# Patient Record
Sex: Female | Born: 1945 | Race: Black or African American | Hispanic: No | Marital: Single | Smoking: Never smoker
Health system: Southern US, Community
[De-identification: ages and names within clinical notes are randomized; demographics above are authoritative.]

## PROBLEM LIST (undated history)

## (undated) DIAGNOSIS — K219 Gastro-esophageal reflux disease without esophagitis: Secondary | ICD-10-CM

## (undated) DIAGNOSIS — E785 Hyperlipidemia, unspecified: Secondary | ICD-10-CM

## (undated) DIAGNOSIS — F0280 Dementia in other diseases classified elsewhere without behavioral disturbance: Secondary | ICD-10-CM

## (undated) DIAGNOSIS — E119 Type 2 diabetes mellitus without complications: Secondary | ICD-10-CM

## (undated) DIAGNOSIS — I10 Essential (primary) hypertension: Secondary | ICD-10-CM

## (undated) HISTORY — DX: Hyperlipidemia, unspecified: E78.5

## (undated) HISTORY — DX: Dementia in other diseases classified elsewhere, unspecified severity, without behavioral disturbance, psychotic disturbance, mood disturbance, and anxiety: F02.80

## (undated) HISTORY — DX: Gastro-esophageal reflux disease without esophagitis: K21.9

## (undated) HISTORY — DX: Type 2 diabetes mellitus without complications: E11.9

---

## 2016-08-08 ENCOUNTER — Emergency Department
Admission: EM | Admit: 2016-08-08 | Discharge: 2016-08-08 | Disposition: A | Discharge status: Home / Self Care | Payer: Medicare HMO | Attending: Emergency Medicine | Admitting: Emergency Medicine

## 2016-08-08 ENCOUNTER — Encounter: Payer: Self-pay | Admitting: Emergency Medicine

## 2016-08-08 DIAGNOSIS — R600 Localized edema: Secondary | ICD-10-CM | POA: Diagnosis present

## 2016-08-08 DIAGNOSIS — I10 Essential (primary) hypertension: Secondary | ICD-10-CM | POA: Insufficient documentation

## 2016-08-08 DIAGNOSIS — Z79899 Other long term (current) drug therapy: Secondary | ICD-10-CM | POA: Insufficient documentation

## 2016-08-08 DIAGNOSIS — T783XXA Angioneurotic edema, initial encounter: Secondary | ICD-10-CM

## 2016-08-08 HISTORY — DX: Essential (primary) hypertension: I10

## 2016-08-08 MED ORDER — METHYLPREDNISOLONE SODIUM SUCC 125 MG IJ SOLR
125.0000 mg | Freq: Once | INTRAMUSCULAR | Status: AC
  Administered 2016-08-08: 125 mg via INTRAVENOUS
  Filled 2016-08-08: qty 2

## 2016-08-08 MED ORDER — PREDNISONE 50 MG PO TABS: 50.0000 mg | ORAL_TABLET | Freq: Every day | ORAL | 0 refills | Status: DC

## 2016-08-08 MED ORDER — FAMOTIDINE IN NACL 20-0.9 MG/50ML-% IV SOLN
20.0000 mg | Freq: Once | INTRAVENOUS | Status: AC
  Administered 2016-08-08: 20 mg via INTRAVENOUS
  Filled 2016-08-08: qty 50

## 2016-08-08 NOTE — ED Provider Notes (Signed)
[facility] Emergency Department Provider Note   ____________________________________________    I have reviewed the triage vital signs and the nursing notes.   HISTORY  Chief Complaint Oral Swelling     HPI Sonya Curry is a 71 y.o. female Who presents with complaints of lip swelling. Daughter reports this started sometime after 2 AM in the morning because she saw her at 2 AM and her lips were normal however the patient does not know the exact time it started. Patient feels this occurs when she goes to bed "stressed out". Patient does take benazepril. This occurred 3-4 months ago as well.No intraoral swelling. No throat swelling. No difficulty breathing or swallowing. No rash   Past Medical History:  Diagnosis Date  . Hypertension     There are no active problems to display for this patient.   History reviewed. No pertinent surgical history.  Prior to Admission medications   Medication Sig Start Date End Date Taking? Authorizing Provider  alendronate (FOSAMAX) 70 MG tablet Take 70 mg by mouth every 7 (seven) days. 07/29/16  Yes [provider]  amLODipine (NORVASC) 10 MG tablet Take 10 mg by mouth daily. 04/29/16 04/29/17 Yes [provider]  aspirin EC 81 MG tablet Take 81 mg by mouth daily.   Yes [provider]  atorvastatin (LIPITOR) 40 MG tablet Take 40 mg by mouth daily. 04/29/16  Yes [provider]  donepezil (ARICEPT) 10 MG tablet Take 10 mg by mouth at bedtime.  07/17/16  Yes [provider]  FLUoxetine (PROZAC) 20 MG capsule Take 20 mg by mouth daily. 05/03/16  Yes [provider]  omeprazole (PRILOSEC) 20 MG capsule Take 20 mg by mouth daily. 07/18/16  Yes [provider]  triamterene-hydrochlorothiazide (DYAZIDE) 37.5-25 MG capsule Take 1 capsule by mouth daily. 06/08/16  Yes [provider]  predniSONE (DELTASONE) 50 MG tablet Take 1 tablet (50 mg total) by mouth daily  with breakfast. 08/08/16   Jene EveryKinner, Kaylinn Dedic, MD     Allergies Patient has no known allergies.  History reviewed. No pertinent family history.  Social History Social History  Substance Use Topics  . Smoking status: Never Smoker  . Smokeless tobacco: Never Used  . Alcohol use No    Review of Systems  Constitutional: No fever/chills Eyes: No visual changes.  ENT:no intraoral swelling Cardiovascular: Denies chest pain. Respiratory: Denies shortness of breath. Gastrointestinal:   No nausea, no vomiting.   Genitourinary: Negative for dysuria. Musculoskeletal: Negative for joint swelling Skin: small ulceration to the right forearm, healing well Neurological: Negative for headaches    ____________________________________________   PHYSICAL EXAM:  VITAL SIGNS: ED Triage Vitals  Enc Vitals Group     BP 08/08/16 1730 135/76     Pulse Rate 08/08/16 1731 83     Resp 08/08/16 1731 16     Temp 08/08/16 1731 98.2 F (36.8 C)     Temp Source 08/08/16 1731 Oral     SpO2 08/08/16 1731 95 %     Weight 08/08/16 1731 47.6 kg (105 lb)     Height 08/08/16 1731 1.549 m (5\' 1" )     Head Circumference --      Peak Flow --      Pain Score 08/08/16 1731 0     Pain Loc --      Pain Edu? --      Excl. in GC? --     Constitutional: Alert and oriented. No acute distress.  Pleasant and interactive Eyes: Conjunctivae are normal.  Head: Atraumatic. Nose: No congestion/rhinnorhea. Mouth/Throat: Mucous membranes are moist.  Swelling to both the upper and lower lips, equilaterally. Pharynx is normal, uvula is normal, no stridor Neck:  Painless ROM Cardiovascular: Normal rate, regular rhythm. Grossly normal heart sounds.  Good peripheral circulation. Respiratory: Normal respiratory effort.  No retractions. Lungs CTAB. Gastrointestinal: Soft and nontender. No distention.  No CVA tenderness. Genitourinary: deferred Musculoskeletal: No lower extremity tenderness nor edema.  Warm and well  perfused Neurologic:  Normal speech and language. No gross focal neurologic deficits are appreciated.  Skin:  Skin is warm, dry. Psychiatric: Mood and affect are normal. Speech and behavior are normal.  ____________________________________________   LABS (all labs ordered are listed, but only abnormal results are displayed)  Labs Reviewed - No data to display ____________________________________________  EKG  None ____________________________________________  RADIOLOGY  None ____________________________________________   PROCEDURES  Procedure(s) performed: No    Critical Care performed: No ____________________________________________   INITIAL IMPRESSION / ASSESSMENT AND PLAN / ED COURSE  Pertinent labs & imaging results that were available during my care of the patient were reviewed by me and considered in my medical decision making (see chart for details).  Patient presented with angioedema, likely related to benazepril. It appears that her lips and swollen for most of the day and it is reassuring that this does not seem to have worsened. We will start with Pepcid and steroids and observe in the department for any worsening   ----------------------------------------- 6:54 PM on 08/08/2016 -----------------------------------------  Patient has had some mild improvement of her swelling. No worsening. This is been stable throughout the day. Upper bed for discharge with outpatient follow-up. Strongly recommend discontinuing benazepril and discussing replacement with PCP    ____________________________________________   FINAL CLINICAL IMPRESSION(S) / ED DIAGNOSES  Final diagnoses:  Angioedema, initial encounter      NEW MEDICATIONS STARTED DURING THIS VISIT:  New Prescriptions   PREDNISONE (DELTASONE) 50 MG TABLET    Take 1 tablet (50 mg total) by mouth daily with breakfast.     Note:  This document was prepared using Dragon voice recognition software  and may include unintentional dictation errors.    Jene Every, MD 08/08/16 [phone]

## 2016-08-08 NOTE — ED Triage Notes (Signed)
Pt ambulatory to tx room in NAD, report lip swelling starting last night, denies any SOB, takes ace inhibitor.

## 2016-08-08 NOTE — ED Notes (Signed)
Pt states swelling to her lips that began at 2 pm, pt denies any distress, resp even and unlabored, pt speaking in full clear sentences, swelling noted to lips

## 2016-08-08 NOTE — Discharge Instructions (Signed)
I recommend discontinuing the Benazepril and discussing a replacement medication with your doctor. Please return if there are any worsening of your symptoms.

## 2017-01-14 ENCOUNTER — Emergency Department
Admission: EM | Admit: 2017-01-14 | Discharge: 2017-01-14 | Disposition: A | Discharge status: Home / Self Care | Payer: Medicare Other | Attending: Emergency Medicine | Admitting: Emergency Medicine

## 2017-01-14 DIAGNOSIS — G309 Alzheimer's disease, unspecified: Secondary | ICD-10-CM | POA: Diagnosis not present

## 2017-01-14 DIAGNOSIS — Z79899 Other long term (current) drug therapy: Secondary | ICD-10-CM | POA: Insufficient documentation

## 2017-01-14 DIAGNOSIS — F0281 Dementia in other diseases classified elsewhere with behavioral disturbance: Secondary | ICD-10-CM | POA: Diagnosis not present

## 2017-01-14 DIAGNOSIS — R4182 Altered mental status, unspecified: Secondary | ICD-10-CM | POA: Diagnosis present

## 2017-01-14 DIAGNOSIS — I10 Essential (primary) hypertension: Secondary | ICD-10-CM | POA: Diagnosis not present

## 2017-01-14 NOTE — ED Triage Notes (Addendum)
Patient ambulatory to triage with steady gait, without difficulty or distress noted, brought in by [place] deputy who reports he received call of pt was out walking, knocking on doors and didn't know why, unsure of how long she had been outside; took pt home & found pt lives alone; chart review indicates pt with hx alzheimers; pt reports that she is here because she was out walking and got cold; pt is alert to self and place but not time; denies any c/o; pt st that [place]; pt is unable to answer any questions regarding her history--unable to complete triage at this time; warm blanket given to pt

## 2017-01-14 NOTE — ED Notes (Addendum)
Sonya Curry with [facility] [phone]) called stating that a case has been opened for this patient. Pt is not able to live alone due to dementia and safety is a concern. APS states daughter is on her way to hospital and patient needs to be discharged to her care, they will follow up after discharge with patient and family to assure safety of living conditions. Daughter is POA and her name is Sonya Curry [phone]).

## 2017-01-14 NOTE — ED Provider Notes (Signed)
[facility] Emergency Department Provider Note    First MD Initiated Contact with Patient 01/14/17 [phone]     (approximate)  I have reviewed the triage vital signs and the nursing notes.  Level 5 caveat: History limited secondary to Alzheimer's disease HISTORY  Chief Complaint Altered Mental Status    HPI Sonya Curry is a 71 y.o. female with history of hypertension and Alzheimer's disease presents to the emergency department [place] who reports that the patient was found walking and knocking on the doors of homes.  Patient states that she remembers walking however does not know where she was was going.  Patient alert and oriented to self only.  Patient does live at home alone.  Patient denies any complaints at present.  Adult Protective Services have been notified.   Past Medical History:  Diagnosis Date  . Hypertension     There are no active problems to display for this patient.  Past surgical history None  Prior to Admission medications   Medication Sig Start Date End Date Taking? Authorizing Provider  alendronate (FOSAMAX) 70 MG tablet Take 70 mg by mouth every 7 (seven) days. 07/29/16   [provider]  amLODipine (NORVASC) 10 MG tablet Take 10 mg by mouth daily. 04/29/16 04/29/17  [provider]  aspirin EC 81 MG tablet Take 81 mg by mouth daily.    [provider]  atorvastatin (LIPITOR) 40 MG tablet Take 40 mg by mouth daily. 04/29/16   [provider]  donepezil (ARICEPT) 10 MG tablet Take 10 mg by mouth at bedtime.  07/17/16   [provider]  FLUoxetine (PROZAC) 20 MG capsule Take 20 mg by mouth daily. 05/03/16   [provider]  omeprazole (PRILOSEC) 20 MG capsule Take 20 mg by mouth daily. 07/18/16   [provider]  triamterene-hydrochlorothiazide (DYAZIDE) 37.5-25 MG capsule Take 1 capsule by mouth daily. 06/08/16   [provider]    Allergies No  known drug allergies No family history on file.  Social History Social History   Tobacco Use  . Smoking status: Never Smoker  . Smokeless tobacco: Never Used  Substance Use Topics  . Alcohol use: No  . Drug use: Not on file    Review of Systems Constitutional: No fever/chills Eyes: No visual changes. ENT: No sore throat. Cardiovascular: Denies chest pain. Respiratory: Denies shortness of breath. Gastrointestinal: No abdominal pain.  No nausea, no vomiting.  No diarrhea.  No constipation. Genitourinary: Negative for dysuria. Musculoskeletal: Negative for neck pain.  Negative for back pain. Integumentary: Negative for rash. Neurological: Negative for headaches, focal weakness or numbness.    ____________________________________________   PHYSICAL EXAM:  VITAL SIGNS: ED Triage Vitals [01/14/17 0322]  Enc Vitals Group     BP (!) 158/76     Pulse Rate 87     Resp 18     Temp 97.6 F (36.4 C)     Temp Source Oral     SpO2 98 %     Weight      Height      Head Circumference      Peak Flow      Pain Score      Pain Loc      Pain Edu?      Excl. in GC?     Constitutional: Alert and oriented only to self. Well appearing and in no acute distress. Eyes: Conjunctivae are normal. PERRL. EOMI. Head: Atraumatic. Mouth/Throat: Mucous  membranes are moist.  Oropharynx non-erythematous. Neck: No stridor.  Cardiovascular: Normal rate, regular rhythm. Good peripheral circulation. Grossly normal heart sounds. Respiratory: Normal respiratory effort.  No retractions. Lungs CTAB. Gastrointestinal: Soft and nontender. No distention.  Musculoskeletal: No lower extremity tenderness nor edema. No gross deformities of extremities. Neurologic:  Normal speech and language. No gross focal neurologic deficits are appreciated.  Skin:  Skin is warm, dry and intact. No rash noted. Psychiatric: Mood and affect are normal. Speech and behavior are  normal.    Procedures   ____________________________________________   INITIAL IMPRESSION / ASSESSMENT AND PLAN / ED COURSE  As part of my medical decision making, I reviewed the following data within the electronic MEDICAL RECORD [id number] year old female presenting the emergency department with history and physical exam consistent with behavioral disturbance secondary to Alzheimer's dementia.  Patient's daughter presented to the emergency department and will be taking her mother home with her. ____________________________________________  FINAL CLINICAL IMPRESSION(S) / ED DIAGNOSES  Final diagnoses:  Alzheimer's dementia with behavioral disturbance, unspecified timing of dementia onset     MEDICATIONS GIVEN DURING THIS VISIT:  Medications - No data to display   ED Discharge Orders    None       Note:  This document was prepared using Dragon voice recognition software and may include unintentional dictation errors.    Darci CurrentBrown, Villisca N, MD 01/14/17 [phone]

## 2020-02-15 ENCOUNTER — Other Ambulatory Visit: Payer: Self-pay

## 2020-02-15 ENCOUNTER — Encounter: Payer: Self-pay | Admitting: Emergency Medicine

## 2020-02-15 ENCOUNTER — Emergency Department
Admission: EM | Admit: 2020-02-15 | Discharge: 2020-02-15 | Disposition: A | Discharge status: Home / Self Care | Payer: Medicare Other | Attending: Emergency Medicine | Admitting: Emergency Medicine

## 2020-02-15 DIAGNOSIS — Z7982 Long term (current) use of aspirin: Secondary | ICD-10-CM | POA: Insufficient documentation

## 2020-02-15 DIAGNOSIS — Z79899 Other long term (current) drug therapy: Secondary | ICD-10-CM | POA: Diagnosis not present

## 2020-02-15 DIAGNOSIS — I10 Essential (primary) hypertension: Secondary | ICD-10-CM | POA: Diagnosis not present

## 2020-02-15 DIAGNOSIS — F0390 Unspecified dementia without behavioral disturbance: Secondary | ICD-10-CM | POA: Insufficient documentation

## 2020-02-15 MED ORDER — LORAZEPAM 0.5 MG PO TABS: 0.5000 mg | ORAL_TABLET | Freq: Three times a day (TID) | ORAL | 0 refills | Status: DC | PRN

## 2020-02-15 MED ORDER — HALOPERIDOL LACTATE 5 MG/ML IJ SOLN
INTRAMUSCULAR | Status: AC
  Filled 2020-02-15: qty 1

## 2020-02-15 MED ORDER — HALOPERIDOL LACTATE 5 MG/ML IJ SOLN: 2.5000 mg | Freq: Once | INTRAMUSCULAR | Status: AC

## 2020-02-15 MED ORDER — LORAZEPAM 2 MG/ML IJ SOLN: 1.0000 mg | Freq: Once | INTRAMUSCULAR | Status: AC

## 2020-02-15 MED ORDER — LORAZEPAM 2 MG/ML IJ SOLN
INTRAMUSCULAR | Status: AC
  Administered 2020-02-15: 20:00:00 1 mg via INTRAMUSCULAR
  Filled 2020-02-15: qty 1

## 2020-02-15 NOTE — ED Provider Notes (Signed)
[facility] Emergency Department Provider Note    ____________________________________________   I have reviewed the triage vital signs and the nursing notes.   HISTORY  Chief Complaint Aggressive Behavior   History limited by and level 5 caveat due to: Dementia. Some history obtained from psychiatric note dated 02/05/20 faxed from facility   HPI Sonya Curry is a 74 y.o. female who presents to the emergency department today because of facility concern for agitation. Per report this has been going on for weeks. Unclear if a specific event occurred today. Also per EMS report facility requested patient be transported to [facility] rather than the significantly closer [facility] facility. Unclear why they would want patient at ER farther from their facility. Patient herself has no complaints.    Records reviewed. Per medical record review patient has a history of dementia, HTN. Psychiatry evaluation ten days ago for similar facility complaint. Medication adjustments made through psychiatrist.   Past Medical History:  Diagnosis Date  . Hypertension     There are no problems to display for this patient.   No past surgical history on file.  Prior to Admission medications   Medication Sig Start Date End Date Taking? Authorizing Provider  alendronate (FOSAMAX) 70 MG tablet Take 70 mg by mouth every 7 (seven) days. 07/29/16   [provider]  amLODipine (NORVASC) 10 MG tablet Take 10 mg by mouth daily. 04/29/16 04/29/17  [provider]  aspirin EC 81 MG tablet Take 81 mg by mouth daily.    [provider]  atorvastatin (LIPITOR) 40 MG tablet Take 40 mg by mouth daily. 04/29/16   [provider]  donepezil (ARICEPT) 10 MG tablet Take 10 mg by mouth at bedtime.  07/17/16   [provider]  FLUoxetine (PROZAC) 20 MG capsule Take 20 mg by mouth daily. 05/03/16   [provider]  omeprazole (PRILOSEC) 20 MG capsule Take 20  mg by mouth daily. 07/18/16   [provider]  triamterene-hydrochlorothiazide (DYAZIDE) 37.5-25 MG capsule Take 1 capsule by mouth daily. 06/08/16   [provider]    Allergies Patient has no known allergies.  No family history on file.  Social History Social History   Tobacco Use  . Smoking status: Never Smoker  . Smokeless tobacco: Never Used  Substance Use Topics  . Alcohol use: No    Review of Systems Constitutional: No fever/chills Eyes: No visual changes. ENT: No sore throat. Cardiovascular: Denies chest pain. Respiratory: Denies shortness of breath. Gastrointestinal: No abdominal pain.  No nausea, no vomiting.  No diarrhea.   Genitourinary: Negative for dysuria. Musculoskeletal: Negative for back pain. Skin: Negative for rash. Neurological: Negative for headaches, focal weakness or numbness.  ____________________________________________   PHYSICAL EXAM: Physical exam limited secondary to patient refusing. VITAL SIGNS: ED Triage Vitals  Enc Vitals Group     BP 165/82     Pulse 77     Resp 16     Temp 98.5     Temp src      SpO2 100   Constitutional: Awake and alert. Not completely oriented.  Eyes: Conjunctivae are normal.  ENT      Head: Normocephalic and atraumatic.      Nose: No congestion/rhinnorhea.      Mouth/Throat: Mucous membranes are moist.      Neck: No stridor. Respiratory: Normal respiratory effort without tachypnea nor retractions. Musculoskeletal: Normal range of motion in all extremities. No lower extremity edema. Neurologic:  Dementia. Not  completely oriented.  Skin:  Skin is warm, dry and intact. No rash noted. Psychiatric: Calm. ____________________________________________    LABS (pertinent positives/negatives)  None  ____________________________________________   EKG  None  ____________________________________________     RADIOLOGY  None  ____________________________________________   PROCEDURES  Procedures  ____________________________________________   INITIAL IMPRESSION / ASSESSMENT AND PLAN / ED COURSE  Pertinent labs & imaging results that were available during my care of the patient were reviewed by me and considered in my medical decision making (see chart for details).   Patient presented to the emergency department today because of concerns for agitation.  This apparently has been going on for a number of weeks.  Patient is coming from living facility.  Patient is quite calm here in the emergency department.  Did review psychiatrist note from [facility].  Do think that facility can go up on her Ativan.  Do recommend that she continues to follow-up with psychiatry that she has been seen.  ____________________________________________   FINAL CLINICAL IMPRESSION(S) / ED DIAGNOSES  Final diagnoses:  Dementia without behavioral disturbance, unspecified dementia type (HCC)     Note: This dictation was prepared with Dragon dictation. Any transcriptional errors that result from this process are unintentional     Phineas Semen, MD 02/15/20 1859

## 2020-02-15 NOTE — ED Notes (Signed)
[facility] @ [phone]. They are aware pt is returning via ems and will have discharge packet.

## 2020-02-15 NOTE — ED Notes (Signed)
Pt confused, wanting to leave ed. Pt persistent but able to redirect with frequent que's.  At no time has pt been aggressive even though she is not happy about being redirected.

## 2020-02-15 NOTE — ED Notes (Signed)
Patient becoming difficult to redirect and walking around unit. See eMAR and new verbal orders per MD Derrill Kay.

## 2020-02-15 NOTE — ED Notes (Signed)
Attempted to call Sonya Curry center @ [phone] to let them know pt was being discharged. No answer, no way to leave message.

## 2020-02-15 NOTE — Discharge Instructions (Addendum)
Sonya Curry was calm in the emergency department today, however for any agitation we have increased the patient's PRN ativan order to TID from BID as documented in recent psychiatry note. Please have Sonya Curry follow up with the psychiatrist that she has been seeing. She had no medical complaints today and we recommend follow up with PCP.
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
## 2020-02-29 ENCOUNTER — Other Ambulatory Visit: Payer: Self-pay

## 2020-02-29 ENCOUNTER — Encounter (HOSPITAL_COMMUNITY): Payer: Self-pay

## 2020-02-29 ENCOUNTER — Emergency Department (HOSPITAL_COMMUNITY)
Admission: EM | Admit: 2020-02-29 | Discharge: 2020-02-29 | Disposition: A | Discharge status: Home / Self Care | Payer: Medicare Other | Attending: Emergency Medicine | Admitting: Emergency Medicine

## 2020-02-29 DIAGNOSIS — E119 Type 2 diabetes mellitus without complications: Secondary | ICD-10-CM | POA: Insufficient documentation

## 2020-02-29 DIAGNOSIS — G309 Alzheimer's disease, unspecified: Secondary | ICD-10-CM | POA: Diagnosis not present

## 2020-02-29 DIAGNOSIS — F0281 Dementia in other diseases classified elsewhere with behavioral disturbance: Secondary | ICD-10-CM | POA: Diagnosis not present

## 2020-02-29 DIAGNOSIS — F0391 Unspecified dementia with behavioral disturbance: Secondary | ICD-10-CM

## 2020-02-29 DIAGNOSIS — I10 Essential (primary) hypertension: Secondary | ICD-10-CM | POA: Insufficient documentation

## 2020-02-29 DIAGNOSIS — R4182 Altered mental status, unspecified: Secondary | ICD-10-CM | POA: Diagnosis present

## 2020-02-29 DIAGNOSIS — Z7982 Long term (current) use of aspirin: Secondary | ICD-10-CM | POA: Insufficient documentation

## 2020-02-29 DIAGNOSIS — Z79899 Other long term (current) drug therapy: Secondary | ICD-10-CM | POA: Diagnosis not present

## 2020-02-29 LAB — BASIC METABOLIC PANEL
Anion gap: 12 (ref 5–15)
BUN: 10 mg/dL (ref 8–23)
CO2: 25 mmol/L (ref 22–32)
Calcium: 9.2 mg/dL (ref 8.9–10.3)
Chloride: 105 mmol/L (ref 98–111)
Creatinine, Ser: 0.92 mg/dL (ref 0.44–1.00)
GFR, Estimated: >60 mL/min (ref >60)
Glucose, Bld: 132 mg/dL — ABNORMAL HIGH (ref 70–99)
Potassium: 4.1 mmol/L (ref 3.5–5.1)
Sodium: 142 mmol/L (ref 135–145)

## 2020-02-29 LAB — URINALYSIS, ROUTINE W REFLEX MICROSCOPIC
Bilirubin Urine: NEGATIVE
Glucose, UA: NEGATIVE mg/dL
Hgb urine dipstick: NEGATIVE
Ketones, ur: 5 mg/dL — AB
Leukocytes,Ua: NEGATIVE
Nitrite: NEGATIVE
Protein, ur: NEGATIVE mg/dL
Specific Gravity, Urine: 1.014 (ref 1.005–1.030)
pH: 7 (ref 5.0–8.0)

## 2020-02-29 LAB — CBC
HCT: 43.9 % (ref 36.0–46.0)
Hemoglobin: 14.4 g/dL (ref 12.0–15.0)
MCH: 30.6 pg (ref 26.0–34.0)
MCHC: 32.8 g/dL (ref 30.0–36.0)
MCV: 93.2 fL (ref 80.0–100.0)
Platelets: 348 10*3/uL (ref 150–400)
RBC: 4.71 MIL/uL (ref 3.87–5.11)
RDW: 13.2 % (ref 11.5–15.5)
WBC: 7.6 10*3/uL (ref 4.0–10.5)
nRBC: 0 % (ref 0.0–0.2)

## 2020-02-29 MED ORDER — LORAZEPAM 2 MG/ML IJ SOLN
1.0000 mg | Freq: Once | INTRAMUSCULAR | Status: AC
  Administered 2020-02-29: 1 mg via INTRAVENOUS
  Filled 2020-02-29: qty 1

## 2020-02-29 NOTE — Discharge Instructions (Addendum)
Sonya Curry was seen in the ER today for dementia with behavioral change.  She had a medical workup including urine screening and metabolic panel, which did not show any acute medical cause for her behavior.  We did not see signs of urine infection.  I strongly suspect this behavior is her chronic dementia.  This is a chronic condition and should be managed by her outpatient provider, not the emergency department.  I recommend that her facility reach out to the local provider, or psychiatric NP or doctor, regarding medical management.
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
## 2020-02-29 NOTE — ED Notes (Signed)
Attempted to call [facility], no answer. Charge nurse aware.

## 2020-02-29 NOTE — ED Provider Notes (Signed)
[facility] EMERGENCY DEPARTMENT Provider Note   CSN: [id number] Arrival date & time: 02/29/20  0957     History Chief Complaint  Patient presents with  . Altered Mental Status    Sonya Curry is a 75 y.o. female presenting from [facility] for behavioral changes.  Patient has dementia cannot provide any history.  I contacted the [facility] staff, they tell me that they were concerned that the patient has been acting out today.  They report the patient was crawling around on the floor knocking of her items.  She was wandering in and out of other patient rooms.  They report that they are psych NP is making adjustments to the patient's psych medicines, but they do not feel it is enough.  They sent her here for behavioral disturbance.  Patient was given 1 mg of Haldol by EMS prior to transportation.  HPI     Past Medical History:  Diagnosis Date  . Alzheimer disease ([place])   . Diabetes mellitus without complication ([place])   . GERD (gastroesophageal reflux disease)   . Hyperlipidemia   . Hypertension     There are no problems to display for this patient.   History reviewed. No pertinent surgical history.   OB History   No obstetric history on file.     History reviewed. No pertinent family history.  Social History   Tobacco Use  . Smoking status: Never Smoker  . Smokeless tobacco: Never Used  Substance Use Topics  . Alcohol use: No    Home Medications Prior to Admission medications   Medication Sig Start Date End Date Taking? Authorizing Provider  alendronate (FOSAMAX) 70 MG tablet Take 70 mg by mouth every 7 (seven) days. 07/29/16   [provider]  amLODipine (NORVASC) 10 MG tablet Take 10 mg by mouth daily. 04/29/16 04/29/17  [provider]  aspirin EC 81 MG tablet Take 81 mg by mouth daily.    [provider]  atorvastatin (LIPITOR) 40 MG tablet Take 40 mg by mouth daily. 04/29/16   [provider]  donepezil (ARICEPT) 10 MG  tablet Take 10 mg by mouth at bedtime.  07/17/16   [provider]  FLUoxetine (PROZAC) 20 MG capsule Take 20 mg by mouth daily. 05/03/16   [provider]  LORazepam (ATIVAN) 0.5 MG tablet Take 1 tablet (0.5 mg total) by mouth every 8 (eight) hours as needed (agitation). 02/15/20 02/14/21  Nance Pear, MD  omeprazole (PRILOSEC) 20 MG capsule Take 20 mg by mouth daily. 07/18/16   [provider]  triamterene-hydrochlorothiazide (DYAZIDE) 37.5-25 MG capsule Take 1 capsule by mouth daily. 06/08/16   [provider]    Allergies    Patient has no known allergies.  Review of Systems   Review of Systems  Unable to perform ROS: Dementia (level 5 caveat)    Physical Exam Updated Vital Signs BP (!) 178/86 (BP Location: Left Arm)   Pulse 99   Temp 98.4 F (36.9 C) (Axillary)   Resp 18   Ht 5\' 6"  (1.676 m)   Wt 54.4 kg   LMP  (LMP Unknown)   SpO2 95%   BMI 19.37 kg/m   Physical Exam Vitals and nursing note reviewed.  Constitutional:      Appearance: She is well-developed and well-nourished.  HENT:     Head: Normocephalic and atraumatic.  Eyes:     Conjunctiva/sclera: Conjunctivae normal.  Cardiovascular:     Rate and Rhythm: Normal rate and  regular rhythm.     Pulses: Normal pulses.  Pulmonary:     Effort: Pulmonary effort is normal. No respiratory distress.  Abdominal:     Palpations: Abdomen is soft.     Tenderness: There is no abdominal tenderness.  Musculoskeletal:        General: No edema.     Cervical back: Neck supple.  Skin:    General: Skin is warm and dry.  Neurological:     Mental Status: She is alert.     Comments: Patient moving all extremities spontaneously, will not cooperate with focal neurological exam No evident facial deficits or droop  Psychiatric:        Mood and Affect: Mood and affect normal.     ED Results / Procedures / Treatments   Labs (all labs ordered are listed, but only abnormal results are  displayed) Labs Reviewed  URINALYSIS, ROUTINE W REFLEX MICROSCOPIC - Abnormal; Notable for the following components:      Result Value   Ketones, ur 5 (*)    All other components within normal limits  BASIC METABOLIC PANEL - Abnormal; Notable for the following components:   Glucose, Bld 132 (*)    All other components within normal limits  CBC    EKG EKG Interpretation  Date/Time:  Thursday February 29 2020 11:34:27 EST Ventricular Rate:  99 PR Interval:    QRS Duration: 88 QT Interval:  389 QTC Calculation: 500 R Axis:   -5 Text Interpretation: Sinus rhythm Low voltage, precordial leads RSR' in V1 or V2, probably normal variant Borderline T abnormalities, anterior leads Borderline prolonged QT interval No STEMI Confirmed by Alvester Chou [phone]) on 02/29/2020 11:49:28 AM   Radiology No results found.  Procedures Procedures (including critical care time)  Medications Ordered in ED Medications  LORazepam (ATIVAN) injection 1 mg (1 mg Intravenous Given 02/29/20 1112)    ED Course  I have reviewed the triage vital signs and the nursing notes.  Pertinent labs & imaging results that were available during my care of the patient were reviewed by me and considered in my medical decision making (see chart for details).  75 year old female w/ advanced dementia presenting from nursing facility with concern for behavioral changes.  She was seen in the emergency department in December for similar behavioral disturbance.  Unfortunately, I suspect this very largely related to her chronic dementia.  We performed medical evaluation including checking for UTI as well as her basic labs here.  I have very low suspicion for stroke.  If her medical work-up is unremarkable, she can be discharged back to her facility.  I already spoke to the staff there and strongly advised that they get in touch with her psych NP regarding her medical management.  As her behavioral disturbances are a chronic  condition, and the patient is not actively suicidal or homicidal, this does not require an emergency psychiatric evaluation.    Clinical Course as of 02/29/20 1411  Thu Feb 29, 2020  1111 Patient snapping with teeth at staff, I've advised 1 mg IV ativan given here. [MT]  1111 Labs unremarkable - no evidence of metabolic derangement or acute anemia.  No leukocytosis. [MT]  1149 ECG reviewed -no acute ischemia, Qtc 500 [MT]  1209 UA unremarkable - medically cleared for discharge [MT]    Clinical Course User Index [MT] Trifan, Kermit Balo, MD    Final Clinical Impression(s) / ED Diagnoses Final diagnoses:  Dementia with behavioral disturbance, unspecified dementia type (HCC)  Rx / DC Orders ED Discharge Orders    None       Trifan, Kermit Balo, MD 02/29/20 [phone]

## 2020-03-01 ENCOUNTER — Other Ambulatory Visit: Payer: Self-pay

## 2020-03-01 ENCOUNTER — Encounter (HOSPITAL_COMMUNITY): Payer: Self-pay

## 2020-03-01 ENCOUNTER — Emergency Department (HOSPITAL_COMMUNITY): Payer: Medicare Other

## 2020-03-01 ENCOUNTER — Emergency Department (HOSPITAL_COMMUNITY)
Admission: EM | Admit: 2020-03-01 | Discharge: 2020-03-01 | Disposition: A | Discharge status: Home / Self Care | Payer: Medicare Other | Attending: Emergency Medicine | Admitting: Emergency Medicine

## 2020-03-01 DIAGNOSIS — Z79899 Other long term (current) drug therapy: Secondary | ICD-10-CM | POA: Insufficient documentation

## 2020-03-01 DIAGNOSIS — I10 Essential (primary) hypertension: Secondary | ICD-10-CM | POA: Diagnosis not present

## 2020-03-01 DIAGNOSIS — R4182 Altered mental status, unspecified: Secondary | ICD-10-CM | POA: Diagnosis present

## 2020-03-01 DIAGNOSIS — F0390 Unspecified dementia without behavioral disturbance: Secondary | ICD-10-CM | POA: Diagnosis not present

## 2020-03-01 DIAGNOSIS — Z7982 Long term (current) use of aspirin: Secondary | ICD-10-CM | POA: Insufficient documentation

## 2020-03-01 DIAGNOSIS — E119 Type 2 diabetes mellitus without complications: Secondary | ICD-10-CM | POA: Insufficient documentation

## 2020-03-01 DIAGNOSIS — F0391 Unspecified dementia with behavioral disturbance: Secondary | ICD-10-CM

## 2020-03-01 LAB — BASIC METABOLIC PANEL
Anion gap: 14 (ref 5–15)
BUN: 13 mg/dL (ref 8–23)
CO2: 22 mmol/L (ref 22–32)
Calcium: 9.1 mg/dL (ref 8.9–10.3)
Chloride: 105 mmol/L (ref 98–111)
Creatinine, Ser: 0.72 mg/dL (ref 0.44–1.00)
GFR, Estimated: >60 mL/min (ref >60)
Glucose, Bld: 162 mg/dL — ABNORMAL HIGH (ref 70–99)
Potassium: 3.5 mmol/L (ref 3.5–5.1)
Sodium: 141 mmol/L (ref 135–145)

## 2020-03-01 LAB — CBC WITH DIFFERENTIAL/PLATELET
Abs Immature Granulocytes: 0.02 10*3/uL (ref 0.00–0.07)
Basophils Absolute: 0 10*3/uL (ref 0.0–0.1)
Basophils Relative: 0 %
Eosinophils Absolute: 0 10*3/uL (ref 0.0–0.5)
Eosinophils Relative: 0 %
HCT: 41 % (ref 36.0–46.0)
Hemoglobin: 13.5 g/dL (ref 12.0–15.0)
Immature Granulocytes: 0 %
Lymphocytes Relative: 13 %
Lymphs Abs: 1.1 10*3/uL (ref 0.7–4.0)
MCH: 30.1 pg (ref 26.0–34.0)
MCHC: 32.9 g/dL (ref 30.0–36.0)
MCV: 91.3 fL (ref 80.0–100.0)
Monocytes Absolute: 0.4 10*3/uL (ref 0.1–1.0)
Monocytes Relative: 4 %
Neutro Abs: 6.9 10*3/uL (ref 1.7–7.7)
Neutrophils Relative %: 83 %
Platelets: 353 10*3/uL (ref 150–400)
RBC: 4.49 MIL/uL (ref 3.87–5.11)
RDW: 13.2 % (ref 11.5–15.5)
WBC: 8.4 10*3/uL (ref 4.0–10.5)
nRBC: 0 % (ref 0.0–0.2)

## 2020-03-01 LAB — URINALYSIS, ROUTINE W REFLEX MICROSCOPIC
Bacteria, UA: NONE SEEN
Bilirubin Urine: NEGATIVE
Glucose, UA: 50 mg/dL — AB
Hgb urine dipstick: NEGATIVE
Ketones, ur: 80 mg/dL — AB
Nitrite: NEGATIVE
Protein, ur: 30 mg/dL — AB
Specific Gravity, Urine: 1.023 (ref 1.005–1.030)
pH: 5 (ref 5.0–8.0)

## 2020-03-01 LAB — VALPROIC ACID LEVEL: Valproic Acid Lvl: <10 ug/mL — ABNORMAL LOW (ref 50.0–100.0)

## 2020-03-01 NOTE — ED Notes (Signed)
Pt wheeled to bathroom

## 2020-03-01 NOTE — ED Notes (Signed)
Pt wheeled to waiting room where pt facility was waiting to take her back. Pt transferred to their wheelchair NAD noted.

## 2020-03-01 NOTE — ED Provider Notes (Signed)
[facility] EMERGENCY DEPARTMENT Provider Note   CSN: [id number] Arrival date & time: 03/01/20  1016     History Chief Complaint  Patient presents with   Altered Mental Status    Sonya Curry is a 75 y.o. female with a history of diabetes, GERD, hypertension and Alzheimer's disease presenting from her local nursing home for evaluation of behavioral changes.  Patient presents today with reduced activity, and nursing staff at the [facility] noted slurred speech and mouth droop this morning and was concern for possible CVA. At baseline, pt tends toward agitation.  This patient was actually seen here yesterday for acute agitation which was felt to be related to her Alzheimer's disease as her work-up here was negative for any acute other sources of this agitation.  Patient can give little history given her Alzheimer's disease.  She does however respond to simple questions which seem appropriate responses.  She does appear drowsy and keeps her eyes closed during the interview.  She denies any pain or discomfort at this time.  She has had her morning dose of her home medications prior to arrival.  She is under the care of a psychiatric nurse practitioner who is currently evaluating medication changes for better control of her Alzheimer's disease and agitation.  HPI     Past Medical History:  Diagnosis Date   Alzheimer disease (HCC)    Diabetes mellitus without complication (HCC)    GERD (gastroesophageal reflux disease)    Hyperlipidemia    Hypertension     There are no problems to display for this patient.   History reviewed. No pertinent surgical history.   OB History   No obstetric history on file.     No family history on file.  Social History   Tobacco Use   Smoking status: Never Smoker   Smokeless tobacco: Never Used  Substance Use Topics   Alcohol use: No    Home Medications Prior to Admission medications   Medication Sig Start Date End Date Taking?  Authorizing Provider  alendronate (FOSAMAX) 70 MG tablet Take 70 mg by mouth every 7 (seven) days. 07/29/16  Yes [provider]  amLODipine (NORVASC) 10 MG tablet Take 10 mg by mouth daily. 04/29/16 03/01/20 Yes [provider]  aspirin EC 81 MG tablet Take 81 mg by mouth daily.   Yes [provider]  atorvastatin (LIPITOR) 40 MG tablet Take 40 mg by mouth daily. 04/29/16  Yes [provider]  donepezil (ARICEPT) 10 MG tablet Take 10 mg by mouth at bedtime.  07/17/16  Yes [provider]  FLUoxetine (PROZAC) 20 MG capsule Take 20 mg by mouth daily. 05/03/16  Yes [provider]  LORazepam (ATIVAN) 0.5 MG tablet Take 1 tablet (0.5 mg total) by mouth every 8 (eight) hours as needed (agitation). 02/15/20 02/14/21 Yes Phineas Semen, MD  omeprazole (PRILOSEC) 20 MG capsule Take 20 mg by mouth daily. 07/18/16  Yes [provider]  triamterene-hydrochlorothiazide (DYAZIDE) 37.5-25 MG capsule Take 1 capsule by mouth daily. 06/08/16  Yes [provider]    Allergies    Patient has no known allergies.  Review of Systems   Review of Systems  Unable to perform ROS: Dementia    Physical Exam Updated Vital Signs BP (!) 143/83    Pulse 77    Temp 98 F (36.7 C) (Oral)    Resp 12    Ht 5\' 6"  (1.676 m)    Wt 54.4 kg    LMP  (  LMP Unknown)    SpO2 100%    BMI 19.37 kg/m   Physical Exam Vitals and nursing note reviewed.  Constitutional:      General: She is not in acute distress.    Appearance: She is well-developed and well-nourished.     Comments: Eyes closed, calm and cooperative.  HENT:     Head: Normocephalic and atraumatic.     Mouth/Throat:     Pharynx: Oropharynx is clear.  Eyes:     Conjunctiva/sclera: Conjunctivae normal.  Cardiovascular:     Rate and Rhythm: Normal rate and regular rhythm.     Pulses: Intact distal pulses.     Heart sounds: Normal heart sounds.  Pulmonary:     Effort: Pulmonary effort is normal. No  respiratory distress.     Breath sounds: Normal breath sounds. No wheezing.  Abdominal:     General: Bowel sounds are normal. There is no distension.     Palpations: Abdomen is soft.     Tenderness: There is no abdominal tenderness. There is no guarding.  Musculoskeletal:        General: Normal range of motion.     Cervical back: No tenderness.  Skin:    General: Skin is warm and dry.  Neurological:     General: No focal deficit present.     Mental Status: She is alert.     Comments: Full neuro exam difficult to perform, pt unable to follow instructions for testing cranial nerves.  Moves all 4 extremities, no facial droop or obvious slurring of speech at this time.   Psychiatric:        Mood and Affect: Mood and affect normal.     ED Results / Procedures / Treatments   Labs (all labs ordered are listed, but only abnormal results are displayed) Labs Reviewed  URINALYSIS, ROUTINE W REFLEX MICROSCOPIC - Abnormal; Notable for the following components:      Result Value   APPearance HAZY (*)    Glucose, UA 50 (*)    Ketones, ur 80 (*)    Protein, ur 30 (*)    Leukocytes,Ua TRACE (*)    All other components within normal limits  BASIC METABOLIC PANEL - Abnormal; Notable for the following components:   Glucose, Bld 162 (*)    All other components within normal limits  VALPROIC ACID LEVEL - Abnormal; Notable for the following components:   Valproic Acid Lvl <10 (*)    All other components within normal limits  CBC WITH DIFFERENTIAL/PLATELET    EKG None  Radiology CT Head Wo Contrast  Result Date: 03/01/2020 CLINICAL DATA:  Altered mental status EXAM: CT HEAD WITHOUT CONTRAST TECHNIQUE: Contiguous axial images were obtained from the base of the skull through the vertex without intravenous contrast. COMPARISON:  None. FINDINGS: Brain: No acute intracranial hemorrhage. No focal mass lesion. No CT evidence of acute infarction. No midline shift or mass effect. No hydrocephalus.  Basilar cisterns are patent. There are periventricular and subcortical white matter hypodensities. Generalized cortical atrophy. Vascular: No hyperdense vessel or unexpected calcification. Skull: Normal. Negative for fracture or focal lesion. Sinuses/Orbits: Paranasal sinuses and mastoid air cells are clear. Orbits are clear. Other: None. IMPRESSION: 1. No acute intracranial findings. 2. Atrophy and white matter microvascular disease. Electronically Signed   By: Suzy Bouchard M.D.   On: 03/01/2020 11:48   DG Chest Portable 1 View  Result Date: 03/01/2020 CLINICAL DATA:  Altered mental status. EXAM: PORTABLE CHEST 1 VIEW COMPARISON:  None. FINDINGS:  Cardiac silhouette is normal in size. No mediastinal or hilar masses or evidence of adenopathy. Clear lungs.  No pleural effusion or pneumothorax. Old healed right lateral fifth rib fracture. No acute skeletal abnormality. IMPRESSION: No active disease. Electronically Signed   By: Amie Portland M.D.   On: 03/01/2020 11:23    Procedures Procedures (including critical care time)  Medications Ordered in ED Medications - No data to display  ED Course  I have reviewed the triage vital signs and the nursing notes.  Pertinent labs & imaging results that were available during my care of the patient were reviewed by me and considered in my medical decision making (see chart for details).    MDM Rules/Calculators/A&P                          Labs and imaging reviewed and reassuring. No obvious medical source for decreased agitation today. No clear neuro deficits on exam, she has confusion but answers simple questions clearly without slurred speech.  No facial droop noted.  CT imaging negative for cva.  depakote level low, so not toxic, also prescribed for psych purposes not seizure disorder so no adjustments needed.  Plan f/u with pcp for recheck for any persistent or new sx.   The patient appears reasonably screened and/or stabilized for discharge and I  doubt any other medical condition or other [facility] requiring further screening, evaluation, or treatment in the ED at this time prior to discharge.  Final Clinical Impression(s) / ED Diagnoses Final diagnoses:  Altered mental status, unspecified altered mental status type  Dementia with behavioral disturbance, unspecified dementia type [facility])    Rx / DC Orders ED Discharge Orders    None       Victoriano Lain 03/01/20 1716    Maia Plan, MD 03/08/20 1128

## 2020-03-01 NOTE — ED Notes (Signed)
Report called to [facility]. Victorino Dike stated that they will come have someone pick her up.

## 2020-03-01 NOTE — ED Notes (Signed)
Pt returned from CT resting. Bed alarm on and in place.

## 2020-03-01 NOTE — ED Triage Notes (Signed)
Pt brought to ED from [facility] for AMS. Per staff at [facility] pt mouth was twisted and she was slurring her words. Pt was seen here yesterday for aggressive behavior.

## 2020-03-01 NOTE — ED Notes (Signed)
Updated daughter Gwynne Kemnitz on pt.

## 2020-03-01 NOTE — Discharge Instructions (Addendum)
Your lab tests, imaging including brain CT scan today is negative for acute stroke or other source of symptoms. Plan to get rechecked by primary provider if not improving.

## 2020-03-01 NOTE — ED Notes (Signed)
Urine specimen collected by in and out cath.

## 2020-03-02 ENCOUNTER — Emergency Department (HOSPITAL_COMMUNITY)
Admission: EM | Admit: 2020-03-02 | Discharge: 2020-03-03 | Disposition: A | Discharge status: Home / Self Care | Payer: Medicare Other | Attending: Emergency Medicine | Admitting: Emergency Medicine

## 2020-03-02 ENCOUNTER — Encounter (HOSPITAL_COMMUNITY): Payer: Self-pay

## 2020-03-02 ENCOUNTER — Other Ambulatory Visit: Payer: Self-pay

## 2020-03-02 DIAGNOSIS — F0391 Unspecified dementia with behavioral disturbance: Secondary | ICD-10-CM | POA: Insufficient documentation

## 2020-03-02 DIAGNOSIS — E119 Type 2 diabetes mellitus without complications: Secondary | ICD-10-CM | POA: Diagnosis not present

## 2020-03-02 DIAGNOSIS — F918 Other conduct disorders: Secondary | ICD-10-CM | POA: Diagnosis present

## 2020-03-02 DIAGNOSIS — Z79899 Other long term (current) drug therapy: Secondary | ICD-10-CM | POA: Diagnosis not present

## 2020-03-02 DIAGNOSIS — Z7982 Long term (current) use of aspirin: Secondary | ICD-10-CM | POA: Diagnosis not present

## 2020-03-02 DIAGNOSIS — I10 Essential (primary) hypertension: Secondary | ICD-10-CM | POA: Insufficient documentation

## 2020-03-02 DIAGNOSIS — Z7984 Long term (current) use of oral hypoglycemic drugs: Secondary | ICD-10-CM | POA: Insufficient documentation

## 2020-03-02 MED ORDER — LORAZEPAM 2 MG/ML IJ SOLN
1.0000 mg | Freq: Once | INTRAMUSCULAR | Status: AC
  Administered 2020-03-02: 1 mg via INTRAMUSCULAR
  Filled 2020-03-02: qty 1

## 2020-03-02 MED ORDER — OLANZAPINE 5 MG PO TABS
5.0000 mg | ORAL_TABLET | Freq: Every day | ORAL | Status: DC
  Administered 2020-03-02: 5 mg via ORAL
  Filled 2020-03-02: qty 1

## 2020-03-02 MED ORDER — LORAZEPAM 1 MG PO TABS
1.0000 mg | ORAL_TABLET | Freq: Once | ORAL | Status: AC
  Administered 2020-03-02: 1 mg via ORAL
  Filled 2020-03-02: qty 1

## 2020-03-02 MED ORDER — ATORVASTATIN CALCIUM 40 MG PO TABS: 40.0000 mg | ORAL_TABLET | Freq: Every day | ORAL | Status: DC

## 2020-03-02 MED ORDER — AMLODIPINE BESYLATE 5 MG PO TABS: 10.0000 mg | ORAL_TABLET | Freq: Every day | ORAL | Status: DC

## 2020-03-02 MED ORDER — HALOPERIDOL 0.5 MG PO TABS
1.0000 mg | ORAL_TABLET | Freq: Four times a day (QID) | ORAL | Status: DC | PRN
  Administered 2020-03-02: 1 mg via ORAL
  Filled 2020-03-02: qty 2

## 2020-03-02 MED ORDER — CARVEDILOL 3.125 MG PO TABS
6.2500 mg | ORAL_TABLET | Freq: Two times a day (BID) | ORAL | Status: DC
Start: 2020-03-02 — End: 2020-03-03
  Administered 2020-03-02: 6.25 mg via ORAL
  Filled 2020-03-02: qty 2

## 2020-03-02 MED ORDER — LORAZEPAM 0.5 MG PO TABS: 0.5000 mg | ORAL_TABLET | Freq: Three times a day (TID) | ORAL | Status: DC | PRN

## 2020-03-02 MED ORDER — DONEPEZIL HCL 5 MG PO TABS
10.0000 mg | ORAL_TABLET | Freq: Every day | ORAL | Status: DC
  Administered 2020-03-02: 10 mg via ORAL
  Filled 2020-03-02: qty 2

## 2020-03-02 MED ORDER — DIVALPROEX SODIUM 250 MG PO DR TAB
500.0000 mg | DELAYED_RELEASE_TABLET | Freq: Two times a day (BID) | ORAL | Status: DC
  Administered 2020-03-02: 500 mg via ORAL
  Filled 2020-03-02: qty 2

## 2020-03-02 MED ORDER — HALOPERIDOL 0.5 MG PO TABS
1.0000 mg | ORAL_TABLET | Freq: Three times a day (TID) | ORAL | Status: DC
  Administered 2020-03-02: 1 mg via ORAL
  Filled 2020-03-02: qty 2
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
## 2020-03-02 NOTE — ED Notes (Signed)
Pt continues to remain agitated, MD aware

## 2020-03-03 MED ORDER — HALOPERIDOL 1 MG PO TABS: 1.0000 mg | ORAL_TABLET | Freq: Four times a day (QID) | ORAL | 0 refills | Status: AC

## 2020-03-03 MED ORDER — LORAZEPAM 1 MG PO TABS: 1.0000 mg | ORAL_TABLET | Freq: Three times a day (TID) | ORAL | 0 refills | Status: AC | PRN

## 2020-03-03 NOTE — ED Provider Notes (Signed)
Facility is willing to come get patient.  Haldol 1 mg QID and Ativan 1 mg prn ordered.  Pt has been calm overnight and is stable for d/c.   Jacalyn Lefevre, MD 03/03/20 [phone]

## 2020-03-03 NOTE — ED Notes (Signed)
Spoke with staff at nursing home regarding patient return. States that she may come back but will need to return via EMS.

## 2020-03-03 NOTE — ED Notes (Signed)
Report given to Fannie Knee at [facility].

## 2021-06-01 IMAGING — CT CT HEAD W/O CM
3 series · 16 of 47 positions shown, 19 images · non-contrast
Comparison: None.

CLINICAL DATA: Altered mental status

EXAM:
CT HEAD WITHOUT CONTRAST
TECHNIQUE: Contiguous axial images were obtained from the base of the skull
through the vertex without intravenous contrast.

[Series 2: head w o · axial · 0.42mm/px · z∈[-10,+115]mm · 10 of 30 slices shown, 13 images]
[im 3/30  brain]
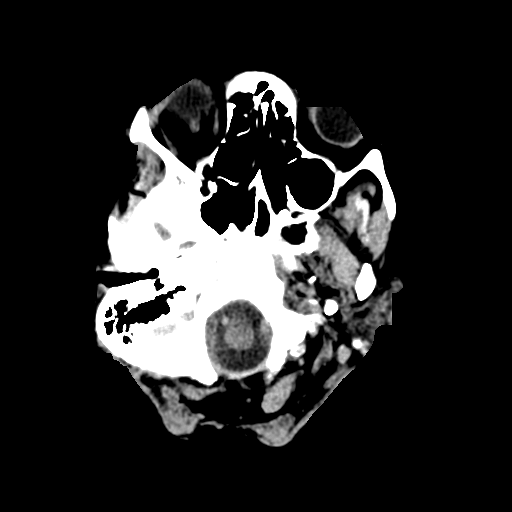
[im 3/30  bone]
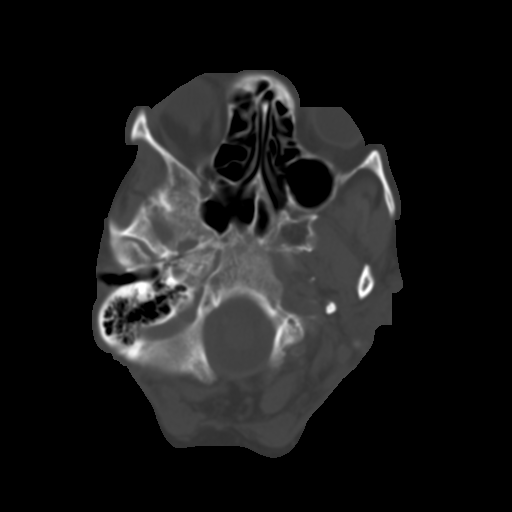
[im 6/30  brain]
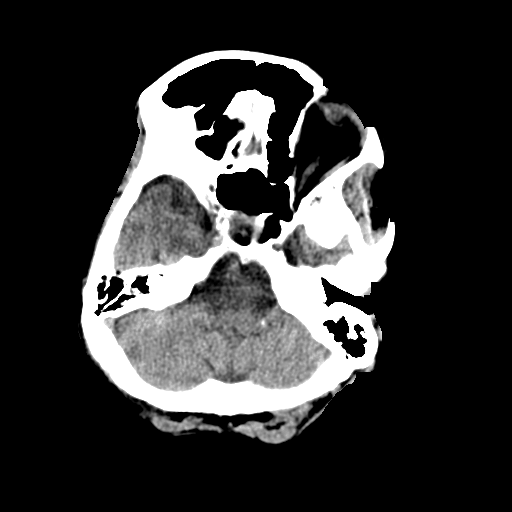
[im 9/30  brain]
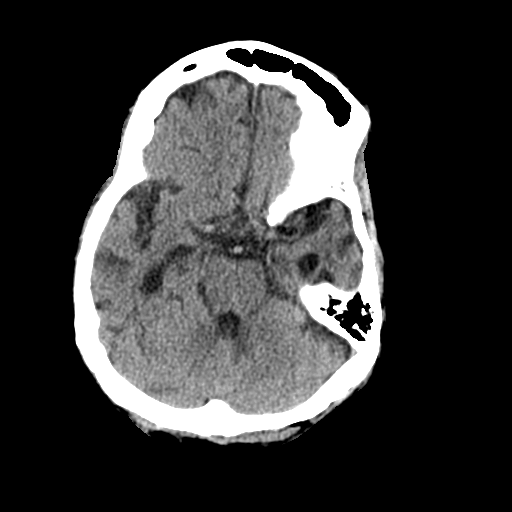
[im 11/30  brain]
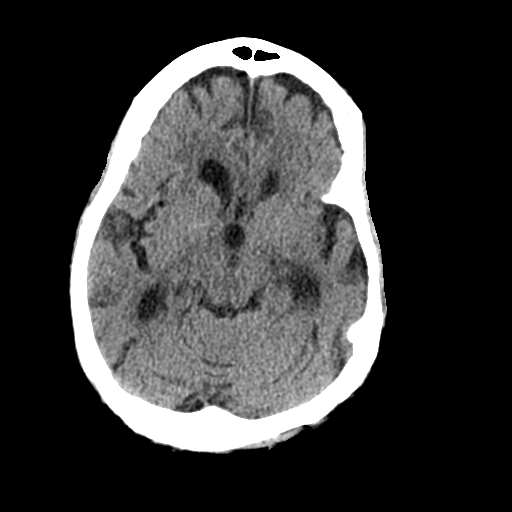
[im 14/30  brain]
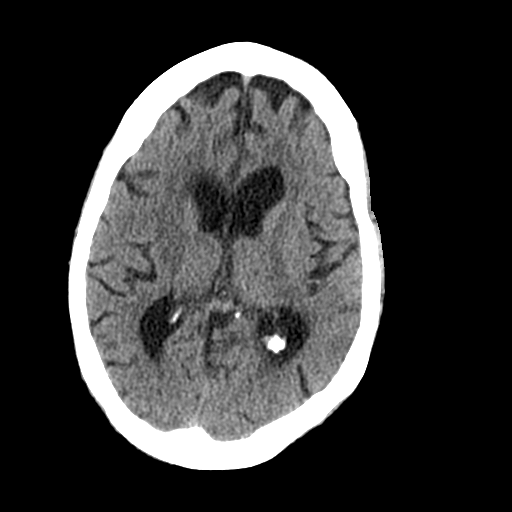
[im 14/30  bone]
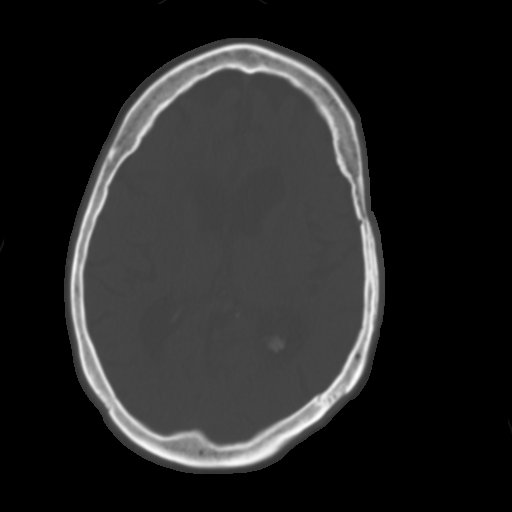
[im 17/30  brain]
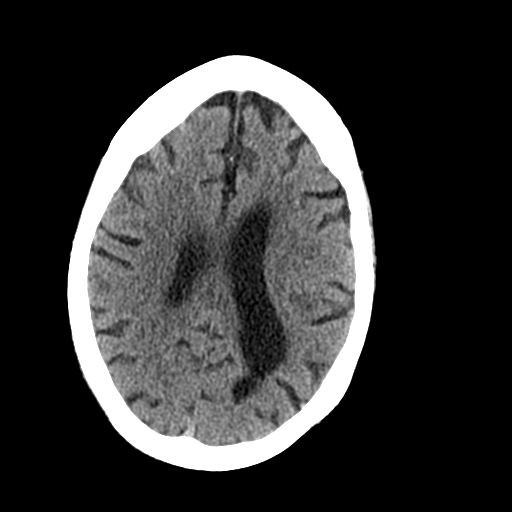
[im 20/30  brain]
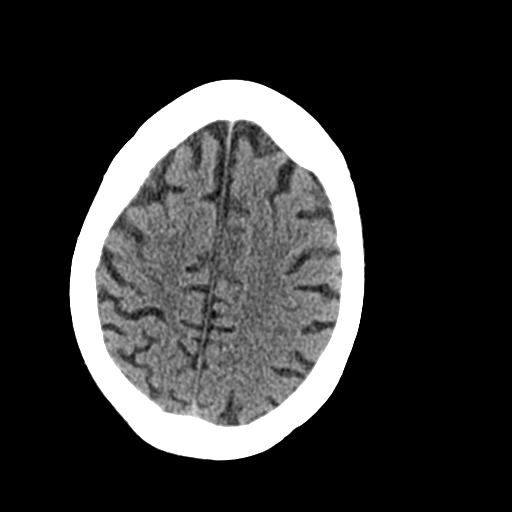
[im 23/30  brain]
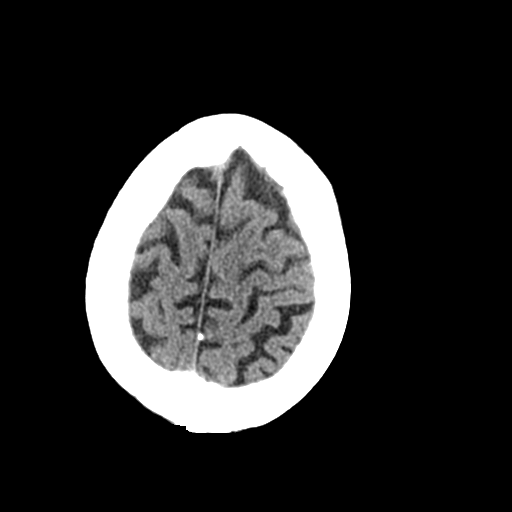
[im 25/30  brain]
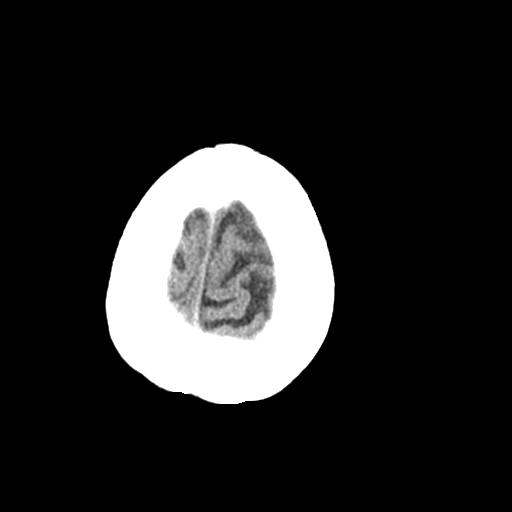
[im 25/30  bone]
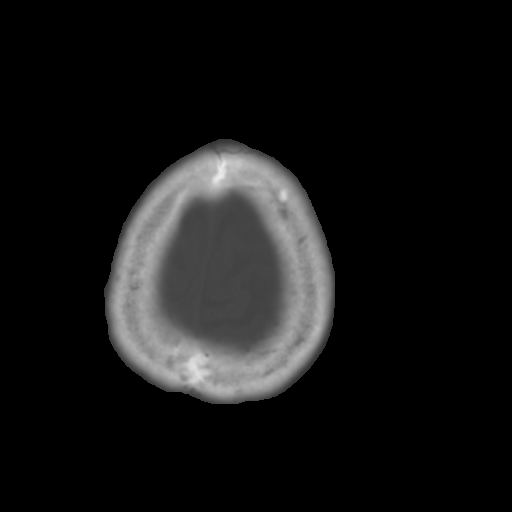
[im 28/30  brain]
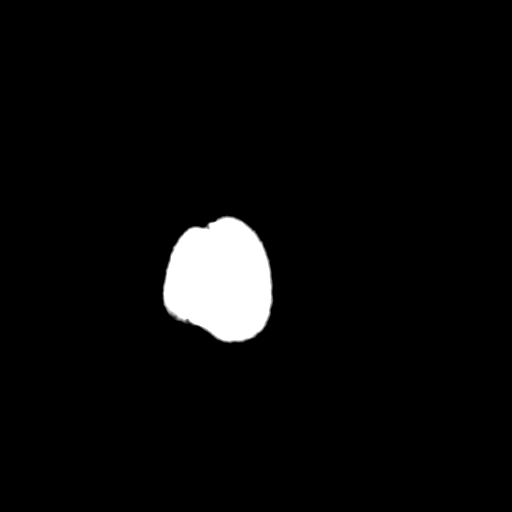

[Series 4: coronal soft · coronal · 0.34mm/px · 3 of 67 slices shown]
[im 23/67  brain]
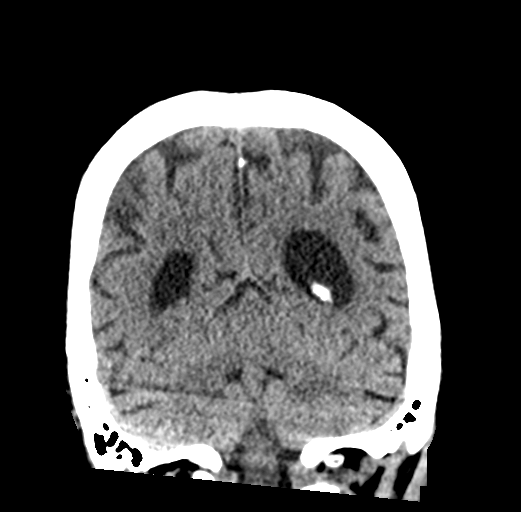
[im 30/67  brain]
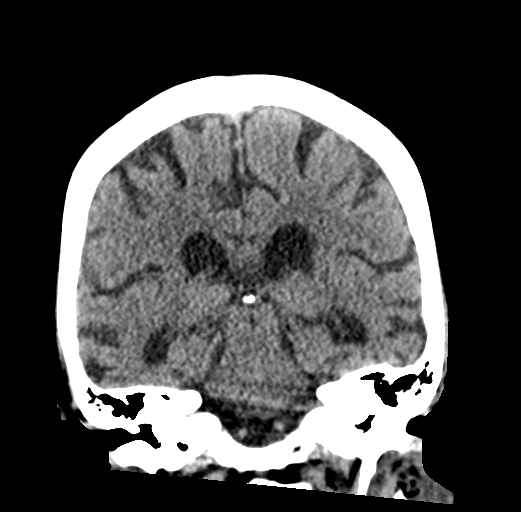
[im 37/67  brain]
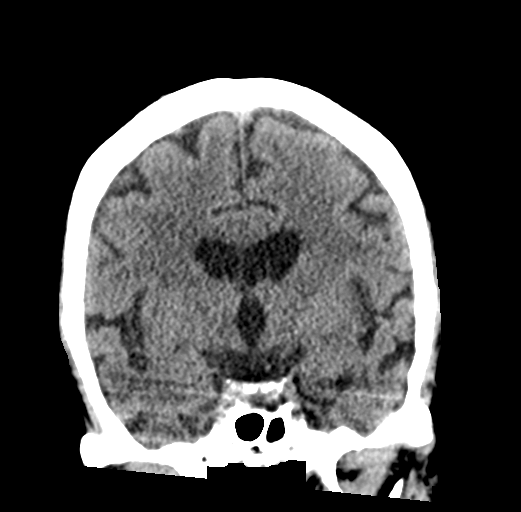

[Series 5: sagittal soft · sagittal · 0.34mm/px · 3 of 53 slices shown]
[im 19/53  brain]
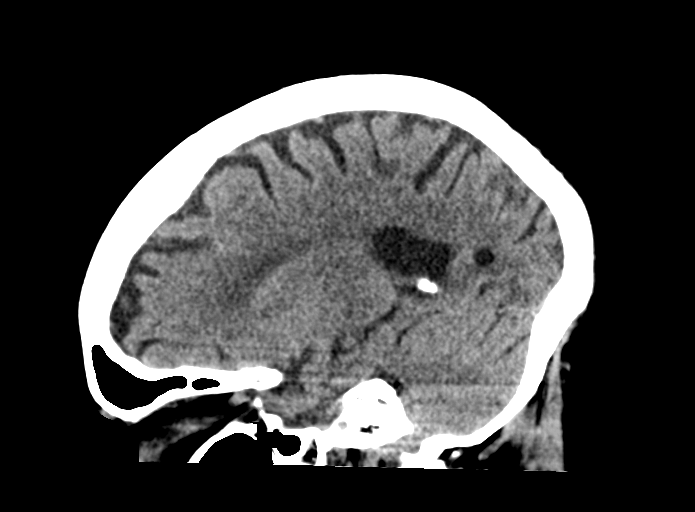
[im 27/53  brain]
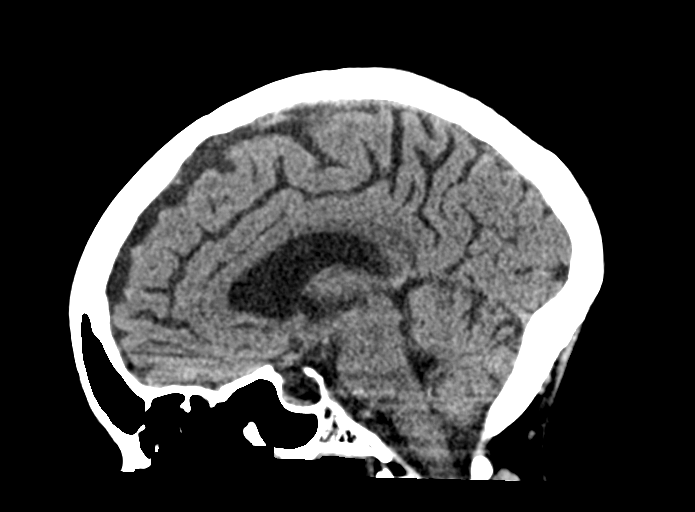
[im 35/53  brain]
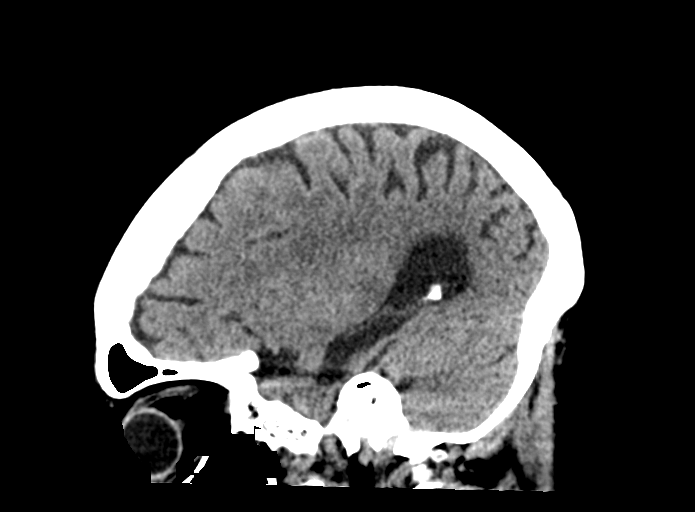

[16 of 47 positions shown; findings below may reference images not displayed]

FINDINGS: Brain: No acute intracranial hemorrhage. No focal mass lesion. No CT
evidence of acute infarction. No midline shift or mass effect. No
hydrocephalus. Basilar cisterns are patent.

There are periventricular and subcortical white matter
hypodensities. Generalized cortical atrophy.

Vascular: No hyperdense vessel or unexpected calcification.

Skull: Normal. Negative for fracture or focal lesion.

Sinuses/Orbits: Paranasal sinuses and mastoid air cells are clear.
Orbits are clear.

Other: None.
IMPRESSION: 1. No acute intracranial findings.
2. Atrophy and white matter microvascular disease.

## 2021-06-01 IMAGING — DX DG CHEST 1V PORT
1 series · 1 of 1 positions shown · non-contrast
Comparison: None.

CLINICAL DATA: Altered mental status.

EXAM:
PORTABLE CHEST 1 VIEW

[chest ap]
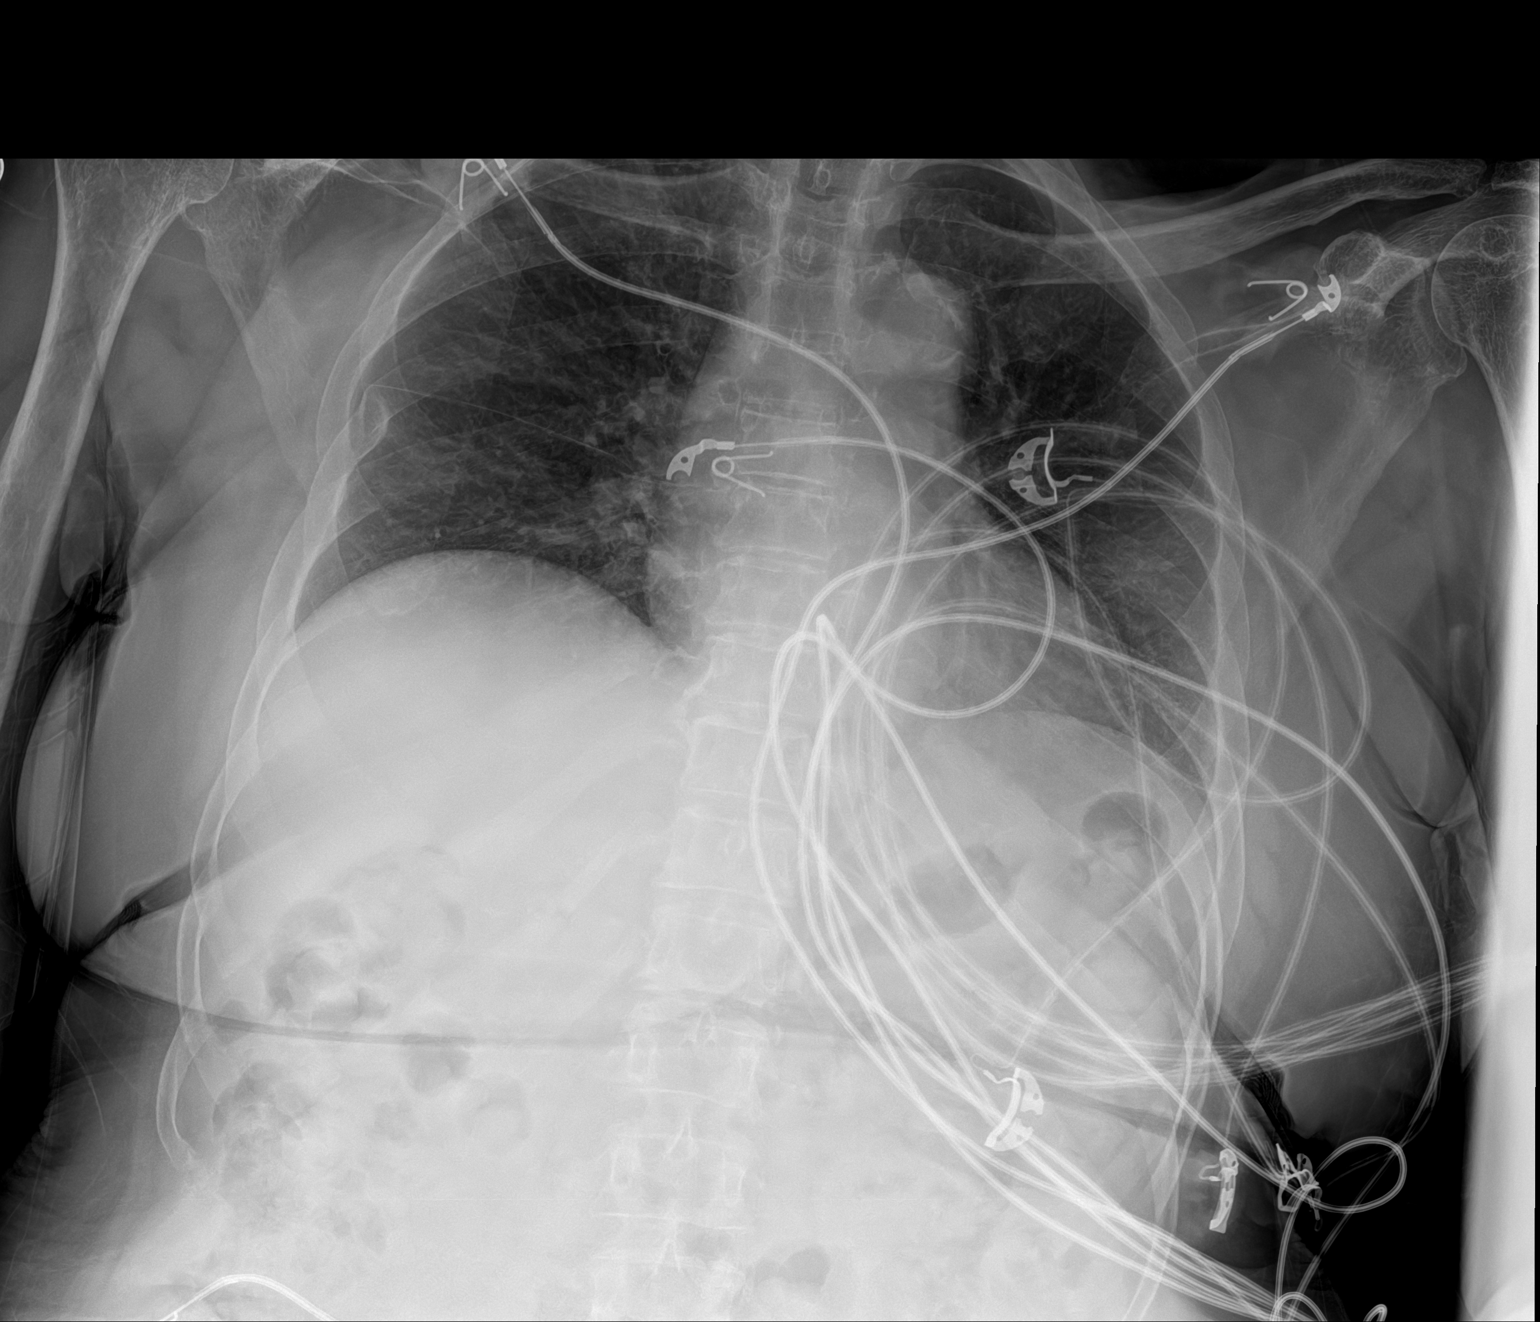

[1 of 1 positions shown; findings below may reference images not displayed]

FINDINGS: Cardiac silhouette is normal in size. No mediastinal or hilar masses
or evidence of adenopathy.

Clear lungs.  No pleural effusion or pneumothorax.

Old healed right lateral fifth rib fracture. No acute skeletal
abnormality.
IMPRESSION: No active disease.
